# Patient Record
Sex: Female | Born: 1985
Health system: Southern US, Community
[De-identification: ages and names within clinical notes are randomized; demographics above are authoritative.]

## PROBLEM LIST (undated history)

## (undated) HISTORY — PX: TONSILLECTOMY: SUR1361

---

## 2015-05-08 ENCOUNTER — Encounter (HOSPITAL_BASED_OUTPATIENT_CLINIC_OR_DEPARTMENT_OTHER): Payer: Self-pay | Admitting: *Deleted

## 2015-05-08 ENCOUNTER — Emergency Department (HOSPITAL_BASED_OUTPATIENT_CLINIC_OR_DEPARTMENT_OTHER)
Admission: EM | Admit: 2015-05-08 | Discharge: 2015-05-08 | Disposition: A | Discharge status: Home / Self Care | Payer: No Typology Code available for payment source | Attending: Emergency Medicine | Admitting: Emergency Medicine

## 2015-05-08 ENCOUNTER — Emergency Department (HOSPITAL_BASED_OUTPATIENT_CLINIC_OR_DEPARTMENT_OTHER): Payer: No Typology Code available for payment source

## 2015-05-08 DIAGNOSIS — Y9289 Other specified places as the place of occurrence of the external cause: Secondary | ICD-10-CM | POA: Diagnosis not present

## 2015-05-08 DIAGNOSIS — S99922A Unspecified injury of left foot, initial encounter: Secondary | ICD-10-CM | POA: Diagnosis present

## 2015-05-08 DIAGNOSIS — S79921A Unspecified injury of right thigh, initial encounter: Secondary | ICD-10-CM | POA: Insufficient documentation

## 2015-05-08 DIAGNOSIS — Y998 Other external cause status: Secondary | ICD-10-CM | POA: Diagnosis not present

## 2015-05-08 DIAGNOSIS — Y9389 Activity, other specified: Secondary | ICD-10-CM | POA: Diagnosis not present

## 2015-05-08 DIAGNOSIS — W010XXA Fall on same level from slipping, tripping and stumbling without subsequent striking against object, initial encounter: Secondary | ICD-10-CM | POA: Diagnosis not present

## 2015-05-08 DIAGNOSIS — S93402A Sprain of unspecified ligament of left ankle, initial encounter: Secondary | ICD-10-CM | POA: Insufficient documentation

## 2015-05-08 MED ORDER — IBUPROFEN 800 MG PO TABS: 800.0000 mg | ORAL_TABLET | Freq: Three times a day (TID) | ORAL | Status: DC

## 2015-05-08 NOTE — Discharge Instructions (Signed)

## 2015-05-08 NOTE — ED Notes (Signed)
Slipped and fell in the shower yesterday. Pain in her right hip and left foot.

## 2015-05-08 NOTE — ED Provider Notes (Signed)
CSN: [id number]     Arrival date & time 05/08/15  1427 History   First MD Initiated Contact with Patient 05/08/15 1438     Chief Complaint  Patient presents with  . Fall  . Leg Pain     (Consider location/radiation/quality/duration/timing/severity/associated sxs/prior Treatment) HPI Comments: Pt comes in with c/o right thigh pain and left foot and ankle pain that started after she slipped and fell out of the shower yesterday. No loc. No dizziness  Patient is a 29 y.o. female presenting with fall. The history is provided by the patient. No language interpreter was used.  Fall This is a new problem. The current episode started yesterday. The problem occurs constantly. The problem has been unchanged. Pertinent negatives include no numbness or weakness. The symptoms are aggravated by walking. She has tried ice and heat for the symptoms.    History reviewed. No pertinent past medical history. Past Surgical History  Procedure Laterality Date  . Tonsillectomy     No family history on file. History  Substance Use Topics  . Smoking status: Never Smoker   . Smokeless tobacco: Not on file  . Alcohol Use: Yes     Comment: weekly   OB History    No data available     Review of Systems  Neurological: Negative for weakness and numbness.  All other systems reviewed and are negative.     Allergies  Review of patient's allergies indicates no known allergies.  Home Medications   Prior to Admission medications   Not on File   BP 127/88 mmHg  Pulse 81  Temp(Src) 98.2 F (36.8 C) (Oral)  Resp 16  Ht 5\' 2"  (1.575 m)  Wt 258 lb (117.028 kg)  BMI 47.18 kg/m2  SpO2 97%  LMP 04/08/2015 Physical Exam  Constitutional: She is oriented to person, place, and time. She appears well-developed and well-nourished.  HENT:  Head: Normocephalic and atraumatic.  Eyes: Conjunctivae and EOM are normal. Pupils are equal, round, and reactive to light.  Cardiovascular: Normal rate and regular  rhythm.   Pulmonary/Chest: Effort normal and breath sounds normal.  Abdominal: Soft. Bowel sounds are normal. There is no tenderness.  Musculoskeletal:  Mild swelling and tenderness to the left ankle. Tender to the bottom of the left foot. Pulses intact. Tender on the right lateral thigh. Full rom  Neurological: She is alert and oriented to person, place, and time. Coordination normal.  Skin: Skin is warm and dry.  Psychiatric: She has a normal mood and affect.  Nursing note and vitals reviewed.   ED Course  Procedures (including critical care time) Labs Review Labs Reviewed - No data to display  Imaging Review Dg Ankle Complete Left  05/08/2015   CLINICAL DATA:  Larey SeatFell in shower yesterday, now with lateral foot pain.  EXAM: LEFT ANKLE COMPLETE - 3+ VIEW  COMPARISON:  Foot radiographs-earlier same day  FINDINGS: There is mild diffuse soft tissue swelling about the ankle. No fracture or dislocation. Joint spaces are preserved. The ankle mortise is preserved. No definite ankle joint. No plantar calcaneal spur. No radiopaque foreign body.  IMPRESSION: Nonspecific mild diffuse soft tissue swelling about the ankle without associated fracture or dislocation.   Electronically Signed   By: Simonne ComeJohn  Watts M.D.   On: 05/08/2015 15:20   Dg Foot Complete Left  05/08/2015   CLINICAL DATA:  29 year old female with a history of fall in the shower  EXAM: LEFT FOOT - COMPLETE 3+ VIEW  COMPARISON:  None.  FINDINGS:  There is no evidence of fracture or dislocation. There is no evidence of arthropathy or other focal bone abnormality. Soft tissues are unremarkable.  IMPRESSION: Negative for acute bony abnormality.  Signed,  Yvone NeuJaime S. Loreta AveWagner, DO  [facility]  [facility]   Electronically Signed   By: Gilmer MorJaime  Wagner D.O.   On: 05/08/2015 15:21     EKG Interpretation None      MDM   Final diagnoses:  Ankle sprain, left, initial encounter    No acute bony abnormality  noted. Pt placed in ace wrap and crutches for comfort. Given ibuprofen and follow up with DR. Pearletha Forgehudnall as needed    Teressa LowerVrinda Jlon Betker, NP 05/08/15 1527  Elwin MochaBlair Walden, MD 05/08/15 1538

## 2016-08-17 DIAGNOSIS — R102 Pelvic and perineal pain: Secondary | ICD-10-CM | POA: Insufficient documentation

## 2017-12-15 DIAGNOSIS — Z3009 Encounter for other general counseling and advice on contraception: Secondary | ICD-10-CM | POA: Diagnosis not present

## 2017-12-19 DIAGNOSIS — B349 Viral infection, unspecified: Secondary | ICD-10-CM | POA: Diagnosis not present

## 2018-01-06 ENCOUNTER — Emergency Department (HOSPITAL_BASED_OUTPATIENT_CLINIC_OR_DEPARTMENT_OTHER)
Admission: EM | Admit: 2018-01-06 | Discharge: 2018-01-06 | Disposition: A | Discharge status: Home / Self Care | Payer: 59 | Attending: Emergency Medicine | Admitting: Emergency Medicine

## 2018-01-06 ENCOUNTER — Encounter (HOSPITAL_BASED_OUTPATIENT_CLINIC_OR_DEPARTMENT_OTHER): Payer: Self-pay

## 2018-01-06 ENCOUNTER — Other Ambulatory Visit: Payer: Self-pay

## 2018-01-06 DIAGNOSIS — N939 Abnormal uterine and vaginal bleeding, unspecified: Secondary | ICD-10-CM | POA: Insufficient documentation

## 2018-01-06 DIAGNOSIS — Z79899 Other long term (current) drug therapy: Secondary | ICD-10-CM | POA: Diagnosis not present

## 2018-01-06 LAB — CBC
HCT: 35.9 % — ABNORMAL LOW (ref 36.0–46.0)
Hemoglobin: 11.3 g/dL — ABNORMAL LOW (ref 12.0–15.0)
MCH: 27.2 pg (ref 26.0–34.0)
MCHC: 31.5 g/dL (ref 30.0–36.0)
MCV: 86.5 fL (ref 78.0–100.0)
PLATELETS: 371 10*3/uL (ref 150–400)
RBC: 4.15 MIL/uL (ref 3.87–5.11)
RDW: 14.4 % (ref 11.5–15.5)
WBC: 12.1 10*3/uL — AB (ref 4.0–10.5)

## 2018-01-06 LAB — URINALYSIS, ROUTINE W REFLEX MICROSCOPIC
Bilirubin Urine: NEGATIVE
Glucose, UA: NEGATIVE mg/dL
KETONES UR: NEGATIVE mg/dL
LEUKOCYTES UA: NEGATIVE
NITRITE: NEGATIVE
PROTEIN: NEGATIVE mg/dL
Specific Gravity, Urine: >1.03 — ABNORMAL HIGH (ref 1.005–1.030)
pH: 6 (ref 5.0–8.0)

## 2018-01-06 LAB — URINALYSIS, MICROSCOPIC (REFLEX)

## 2018-01-06 LAB — PREGNANCY, URINE: PREG TEST UR: NEGATIVE

## 2018-01-06 MED ORDER — FENTANYL CITRATE (PF) 100 MCG/2ML IJ SOLN: 50.0000 ug | Freq: Once | INTRAMUSCULAR | Status: DC

## 2018-01-06 NOTE — Discharge Instructions (Signed)
You were seen and evaluated in the emergency department for heavy menstrual bleeding. Her tests here were normal. You are not anemic.  It is safe to take 600 mg of ibuprofen once per day to help decrease bleeding between now and Tuesday when you have your OB appointment.  Reasons to return to care would be if you have continued to have heavy bleeding in this area lightheadedness, shortness of breath, or palpitations.

## 2018-01-06 NOTE — ED Triage Notes (Signed)
C/o heavy vaginal bleeding x 2 days-states her job sent her here because she bled through 2 pairs of pants at [employer]-steady gait

## 2018-01-06 NOTE — ED Notes (Signed)
Pt verbalizes understanding of d/c instructions and denies any further needs at this time. 

## 2018-01-06 NOTE — ED Provider Notes (Signed)
[facility] EMERGENCY DEPARTMENT Provider Note   CSN: [id number] Arrival date & [id number]: 01/06/18  2126     History   Chief Complaint Chief Complaint  Patient presents with  . Vaginal Bleeding    HPI Christine Aguirre is a 32 y.o. female presenting for heavy menstrual bleeding. She reports that she had onset of menstrual bleeding yesterday, and since then she has required a tampon and pad every 2 and half hours. She bled through 2 pairs of pants at work today. Because of this it was suggested she come into the emergency department to have her hemoglobin checked. She denies shortness of breath and palpitations, no dizziness. She has had some cramping with menses.  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 This is her third menstrual cycle since restarting menses after delivering her baby.  History reviewed. No pertinent past medical history.  There are no active problems to display for this patient.   Past Surgical History:  Procedure Laterality Date  . TONSILLECTOMY      OB History    No data available       Home Medications    Prior to Admission medications   Medication Sig Start Date End Date Taking? Authorizing Provider  PHENTERMINE HCL PO Take by mouth.   Yes [provider]    Family History No family history on file.  Social History Social History   Tobacco Use  . Smoking status: Never Smoker  . Smokeless tobacco: Never Used  Substance Use Topics  . Alcohol use: No    Frequency: Never    Comment: weekly  . Drug use: No     Allergies   Belladonna alkaloids   Review of Systems Review of Systems No N/V/D/C, no urinary symptoms   Physical Exam Updated Vital Signs BP (!) 145/99 (BP Location: Left Arm)   Pulse 88   Temp 97.8 F (36.6 C) (Oral)   Resp 16   Ht 5\' 2"  (1.575 m)   Wt 119.3 kg (263 lb)   LMP 01/04/2018   SpO2 100%   BMI 48.10 kg/m   Physical Exam  Constitutional: She is oriented to  person, place, and time. She appears well-developed and well-nourished.  HENT:  Head: Normocephalic and atraumatic.  Neck: Neck supple.  Cardiovascular: Normal rate and regular rhythm. Exam reveals no gallop and no friction rub.  No murmur heard. Pulmonary/Chest: Effort normal and breath sounds normal.  Abdominal: Soft. She exhibits no distension. There is no tenderness. There is no guarding.  Genitourinary: Vagina normal.  Genitourinary Comments: +visualized cervix which is without abnormalities, note blood in the vault with no clotting.   Musculoskeletal: She exhibits no deformity.  Neurological: She is alert and oriented to person, place, and time.  Skin: Skin is warm and dry.     ED Treatments / Results  Labs (all labs ordered are listed, but only abnormal results are displayed) Labs Reviewed  URINALYSIS, ROUTINE W REFLEX MICROSCOPIC - Abnormal; Notable for the following components:      Result Value   APPearance CLOUDY (*)    Specific Gravity, Urine >1.030 (*)    Hgb urine dipstick LARGE (*)    All other components within normal limits  CBC - Abnormal; Notable for the following components:   WBC 12.1 (*)    Hemoglobin 11.3 (*)    HCT 35.9 (*)    All other components within normal limits  URINALYSIS, MICROSCOPIC (REFLEX) - Abnormal; Notable for the  following components:   Bacteria, UA RARE (*)    Squamous Epithelial / LPF 0-5 (*)    All other components within normal limits  PREGNANCY, URINE    EKG  EKG Interpretation None       Radiology No results found.  Procedures Procedures (including critical care time)  Medications Ordered in ED Medications  fentaNYL (SUBLIMAZE) injection 50 mcg (not administered)     Initial Impression / Assessment and Plan / ED Course  I have reviewed the triage vital signs and the nursing notes.  Pertinent labs & imaging results that were available during my care of the patient were reviewed by me and considered in my medical  decision making (see chart for details).    Previously healthy 32 year old female presenting for heavy menstrual bleeding. Patient appears well on exam. Lab work including hemoglobin was normal. She is not symptomatic other than mild cramping menses. She is advised to use NSAIDs to decrease menstrual bleeding. She has follow-up with OB on Tuesday for IUD placement.  Return precautions were discussed. Patient appeared well and stable for discharge.   Final Clinical Impressions(s) / ED Diagnoses   Final diagnoses:  Vaginal bleeding    ED Discharge Orders    None       Howard Pouch, MD 01/06/18 4098    Gwyneth Sprout, MD 01/07/18 1112

## 2018-01-10 DIAGNOSIS — Z30430 Encounter for insertion of intrauterine contraceptive device: Secondary | ICD-10-CM | POA: Diagnosis not present

## 2019-01-16 DIAGNOSIS — Z01419 Encounter for gynecological examination (general) (routine) without abnormal findings: Secondary | ICD-10-CM | POA: Diagnosis not present

## 2019-01-16 DIAGNOSIS — Z124 Encounter for screening for malignant neoplasm of cervix: Secondary | ICD-10-CM | POA: Diagnosis not present

## 2019-01-16 DIAGNOSIS — Z30431 Encounter for routine checking of intrauterine contraceptive device: Secondary | ICD-10-CM | POA: Diagnosis not present

## 2019-01-16 DIAGNOSIS — Z1151 Encounter for screening for human papillomavirus (HPV): Secondary | ICD-10-CM | POA: Diagnosis not present

## 2019-01-16 DIAGNOSIS — Z3202 Encounter for pregnancy test, result negative: Secondary | ICD-10-CM | POA: Diagnosis not present

## 2019-01-24 MED FILL — CYCLOBENZAPRINE 10 MG TAB: 10 | 10 days supply | Qty: 10 | Fill #0

## 2019-06-04 ENCOUNTER — Encounter: Payer: 59 | Admitting: Podiatry

## 2019-06-04 ENCOUNTER — Ambulatory Visit: Payer: 59

## 2019-06-04 DIAGNOSIS — M2041 Other hammer toe(s) (acquired), right foot: Secondary | ICD-10-CM

## 2019-06-04 DIAGNOSIS — M2042 Other hammer toe(s) (acquired), left foot: Secondary | ICD-10-CM

## 2019-06-04 NOTE — Progress Notes (Signed)
This encounter was created in error - please disregard.

## 2019-06-13 ENCOUNTER — Ambulatory Visit (INDEPENDENT_AMBULATORY_CARE_PROVIDER_SITE_OTHER): Payer: 59

## 2019-06-13 ENCOUNTER — Other Ambulatory Visit: Payer: Self-pay

## 2019-06-13 ENCOUNTER — Other Ambulatory Visit: Payer: Self-pay | Admitting: Podiatry

## 2019-06-13 ENCOUNTER — Ambulatory Visit (INDEPENDENT_AMBULATORY_CARE_PROVIDER_SITE_OTHER): Payer: 59 | Admitting: Podiatry

## 2019-06-13 VITALS — Temp 97.9°F

## 2019-06-13 DIAGNOSIS — M2042 Other hammer toe(s) (acquired), left foot: Secondary | ICD-10-CM | POA: Diagnosis not present

## 2019-06-13 DIAGNOSIS — M79672 Pain in left foot: Secondary | ICD-10-CM | POA: Diagnosis not present

## 2019-06-13 DIAGNOSIS — M2041 Other hammer toe(s) (acquired), right foot: Secondary | ICD-10-CM | POA: Diagnosis not present

## 2019-06-13 DIAGNOSIS — M79671 Pain in right foot: Secondary | ICD-10-CM | POA: Diagnosis not present

## 2019-06-13 NOTE — Patient Instructions (Signed)
Pre-Operative Instructions  Congratulations, you have decided to take an important step towards improving your quality of life.  You can be assured that the doctors and staff at [facility] will be with you every step of the way.  Here are some important things you should know:  1. Plan to be at the surgery center/hospital at least 1 (one) hour prior to your scheduled time, unless otherwise directed by the surgical center/hospital staff.  You must have a responsible adult accompany you, remain during the surgery and drive you home.  Make sure you have directions to the surgical center/hospital to ensure you arrive on time. 2. If you are having surgery at [facility] or [facility] hospitals, you will need a copy of your medical history and physical form from your family physician within one month prior to the date of surgery. We will give you a form for your primary physician to complete.  3. We make every effort to accommodate the date you request for surgery.  However, there are times where surgery dates or times have to be moved.  We will contact you as soon as possible if a change in schedule is required.   4. No aspirin/ibuprofen for one week before surgery.  If you are on aspirin, any non-steroidal anti-inflammatory medications (Mobic, Aleve, Ibuprofen) should not be taken seven (7) days prior to your surgery.  You make take Tylenol for pain prior to surgery.  5. Medications - If you are taking daily heart and blood pressure medications, seizure, reflux, allergy, asthma, anxiety, pain or diabetes medications, make sure you notify the surgery center/hospital before the day of surgery so they can tell you which medications you should take or avoid the day of surgery. 6. No food or drink after midnight the night before surgery unless directed otherwise by surgical center/hospital staff. 7. No alcoholic beverages 24-hours prior to surgery.  No smoking 24-hours prior or 24-hours after  surgery. 8. Wear loose pants or shorts. They should be loose enough to fit over bandages, boots, and casts. 9. Don't wear slip-on shoes. Sneakers are preferred. 10. Bring your boot with you to the surgery center/hospital.  Also bring crutches or a walker if your physician has prescribed it for you.  If you do not have this equipment, it will be provided for you after surgery. 11. If you have not been contacted by the surgery center/hospital by the day before your surgery, call to confirm the date and time of your surgery. 12. Leave-time from work may vary depending on the type of surgery you have.  Appropriate arrangements should be made prior to surgery with your employer. 13. Prescriptions will be provided immediately following surgery by your doctor.  Fill these as soon as possible after surgery and take the medication as directed. Pain medications will not be refilled on weekends and must be approved by the doctor. 14. Remove nail polish on the operative foot and avoid getting pedicures prior to surgery. 15. Wash the night before surgery.  The night before surgery wash the foot and leg well with water and the antibacterial soap provided. Be sure to pay special attention to beneath the toenails and in between the toes.  Wash for at least three (3) minutes. Rinse thoroughly with water and dry well with a towel.  Perform this wash unless told not to do so by your physician.  Enclosed: 1 Ice pack (please put in freezer the night before surgery)   1 Hibiclens skin cleaner     Pre-op instructions  If you have any questions regarding the instructions, please do not hesitate to call our office.  [place]: [address], , [place] [address] -- [phone]  [place]: [address] [place], [place] [address] -- [phone]  [place]: [address]  [place], [place] [address] -- [phone]  [place]: [address], [address], [place], [place] [address] -- [phone]  Website:  [email or web address] 

## 2019-06-14 ENCOUNTER — Other Ambulatory Visit: Payer: Self-pay | Admitting: Podiatry

## 2019-06-14 DIAGNOSIS — M2041 Other hammer toe(s) (acquired), right foot: Secondary | ICD-10-CM

## 2019-06-17 NOTE — Progress Notes (Signed)
Subjective:   Patient ID: Christine Aguirre, female   DOB: 33 y.o.   MRN: [id number]   HPI Patient presents stating she is got very painful fourth and fifth digits of both feet and states that she is tried to trim it she is tried to pad it and wear wider shoes without relief.  Patient states it is been going on for several years and getting worse and patient does not smoke likes to be active and is obese which is complicating factor   Review of Systems  All other systems reviewed and are negative.       Objective:  Physical Exam Vitals signs and nursing note reviewed.  Constitutional:      Appearance: She is well-developed.  Pulmonary:     Effort: Pulmonary effort is normal.  Musculoskeletal: Normal range of motion.  Skin:    General: Skin is warm.  Neurological:     Mental Status: She is alert.     Neurovascular status found to be intact muscle strength is adequate range of motion within normal limits.  Patient is found to have significant rotation fifth digit bilateral pressing into the fourth toe of both feet that are painful when palpated and making shoe gear difficult.  Patient does have keratotic lesions fourth digit bilateral that are painful.  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  I recommended distal arthroplasty digit 5 bilateral and proximal arthroplasty digit 4 and allowed patient to read consent form going over all possible complications and going over alternative treatments.  Patient wants surgery and after extensive review signed consent form and is encouraged to call with questions but needs to get it done fairly soon.  I advised her on approximate 28-month recovery and she understands this wants surgery and is scheduled for outpatient  procedures  X-rays indicates rotation fifth digit bilateral pressing into the fourth toe with enlargement around the head of proximal phalanx digit 4 bilateral

## 2019-06-29 ENCOUNTER — Telehealth: Payer: Self-pay | Admitting: *Deleted

## 2019-06-29 NOTE — Telephone Encounter (Addendum)
DOS 07/03/2019; 80165 - HAMMER TOE REPAIR 4TH B/L AND DISTAL 5TH B/L FOOT  UMR: Effective Date - 12/20/2018   Benefit percentage  60%Plan pays  40%You pay  Family integrated deductible  $0.00 to go     $2,800.00 out of $2800.00   Family integrated out-of-pocket  $4,148.81 to go     $3,851.19 out of $8000.00

## 2019-07-03 ENCOUNTER — Encounter: Payer: Self-pay | Admitting: Podiatry

## 2019-07-03 DIAGNOSIS — M2041 Other hammer toe(s) (acquired), right foot: Secondary | ICD-10-CM | POA: Diagnosis not present

## 2019-07-03 DIAGNOSIS — Z01818 Encounter for other preprocedural examination: Secondary | ICD-10-CM | POA: Diagnosis not present

## 2019-07-03 DIAGNOSIS — M2042 Other hammer toe(s) (acquired), left foot: Secondary | ICD-10-CM | POA: Diagnosis not present

## 2019-07-09 ENCOUNTER — Ambulatory Visit (INDEPENDENT_AMBULATORY_CARE_PROVIDER_SITE_OTHER): Payer: 59

## 2019-07-09 ENCOUNTER — Ambulatory Visit (INDEPENDENT_AMBULATORY_CARE_PROVIDER_SITE_OTHER): Payer: Self-pay | Admitting: Podiatry

## 2019-07-09 ENCOUNTER — Other Ambulatory Visit: Payer: Self-pay

## 2019-07-09 ENCOUNTER — Encounter: Payer: Self-pay | Admitting: Podiatry

## 2019-07-09 DIAGNOSIS — M2042 Other hammer toe(s) (acquired), left foot: Secondary | ICD-10-CM

## 2019-07-09 DIAGNOSIS — Z09 Encounter for follow-up examination after completed treatment for conditions other than malignant neoplasm: Secondary | ICD-10-CM

## 2019-07-09 DIAGNOSIS — M2041 Other hammer toe(s) (acquired), right foot: Secondary | ICD-10-CM

## 2019-07-09 NOTE — Progress Notes (Signed)
Subjective:   Patient ID: Christine Aguirre, female   DOB: 33 y.o.   MRN: [id number]   HPI Patient presents stating she is doing very well postsurgically and has had some discomfort but it is more due to the dressing   ROS      Objective:  Physical Exam  Neurovascular status intact negative Homans sign noted with patient's fourth and fifth digits bilateral showing good alignment and good rotation with stitches in place with no drainage     Assessment:  Doing well post arthroplasty digit 4 5 both feet     Plan:  H&P x-rays reviewed and advised on continued elevation compression immobilization and reappoint 2 weeks suture removal or earlier if needed  X-rays indicate satisfactory resection of bone with no indication of pathology

## 2019-07-23 ENCOUNTER — Ambulatory Visit (INDEPENDENT_AMBULATORY_CARE_PROVIDER_SITE_OTHER): Payer: 59

## 2019-07-23 ENCOUNTER — Ambulatory Visit (INDEPENDENT_AMBULATORY_CARE_PROVIDER_SITE_OTHER): Payer: 59 | Admitting: Podiatry

## 2019-07-23 ENCOUNTER — Encounter: Payer: Self-pay | Admitting: Podiatry

## 2019-07-23 ENCOUNTER — Other Ambulatory Visit: Payer: Self-pay

## 2019-07-23 VITALS — Temp 97.6°F

## 2019-07-23 DIAGNOSIS — Z09 Encounter for follow-up examination after completed treatment for conditions other than malignant neoplasm: Secondary | ICD-10-CM | POA: Diagnosis not present

## 2019-07-23 DIAGNOSIS — M2041 Other hammer toe(s) (acquired), right foot: Secondary | ICD-10-CM

## 2019-07-23 DIAGNOSIS — M2042 Other hammer toe(s) (acquired), left foot: Secondary | ICD-10-CM | POA: Diagnosis not present

## 2019-07-25 NOTE — Progress Notes (Signed)
Subjective:   Patient ID: Christine Aguirre, female   DOB: 33 y.o.   MRN: [id number]   HPI Patient states doing fine overall and states that the patient's digits are healing well and she is ready to get stitches out but overall feels good   ROS      Objective:  Physical Exam  Digital deformity digits 4 5 bilateral that have healed well from surgery with wound edges well coapted good positional component noted     Assessment:  Doing well post digital arthroplasty digit 4 5 both feet     Plan:  H&P reviewed condition stitches removed wound edges coapted well and continue with wider shoe and will be seen back as needed  X-ray indicated satisfactory resection of bone with good alignment of the digits noted

## 2019-08-29 ENCOUNTER — Ambulatory Visit (INDEPENDENT_AMBULATORY_CARE_PROVIDER_SITE_OTHER): Payer: 59 | Admitting: Podiatry

## 2019-08-29 ENCOUNTER — Other Ambulatory Visit: Payer: Self-pay

## 2019-08-29 ENCOUNTER — Encounter: Payer: Self-pay | Admitting: Podiatry

## 2019-08-29 ENCOUNTER — Ambulatory Visit (INDEPENDENT_AMBULATORY_CARE_PROVIDER_SITE_OTHER): Payer: 59

## 2019-08-29 DIAGNOSIS — M2041 Other hammer toe(s) (acquired), right foot: Secondary | ICD-10-CM

## 2019-08-29 DIAGNOSIS — M2042 Other hammer toe(s) (acquired), left foot: Secondary | ICD-10-CM

## 2019-09-01 NOTE — Progress Notes (Signed)
Subjective:   Patient ID: Christine Aguirre, female   DOB: 33 y.o.   MRN: [id number]   HPI Patient states overall doing very well with small bumps right and left fifth and fourth toe but overall doing very well with surgery neuro   ROS      Objective:  Physical Exam  Vascular status intact negative Homans sign noted fourth and fifth digits bilateral healing well wound edges well coapted     Assessment:  Doing well post arthroplasty digit 4 5 bilateral     Plan:  H&P reviewed condition and advised on continued elevation and gradual return to normal shoe gear and will be seen back as needed

## 2020-02-06 DIAGNOSIS — Z111 Encounter for screening for respiratory tuberculosis: Secondary | ICD-10-CM | POA: Diagnosis not present

## 2020-02-06 DIAGNOSIS — Z021 Encounter for pre-employment examination: Secondary | ICD-10-CM | POA: Diagnosis not present

## 2020-03-17 ENCOUNTER — Emergency Department (HOSPITAL_BASED_OUTPATIENT_CLINIC_OR_DEPARTMENT_OTHER)
Admission: EM | Admit: 2020-03-17 | Discharge: 2020-03-18 | Disposition: A | Discharge status: Home / Self Care | Payer: 59 | Attending: Emergency Medicine | Admitting: Emergency Medicine

## 2020-03-17 ENCOUNTER — Encounter (HOSPITAL_BASED_OUTPATIENT_CLINIC_OR_DEPARTMENT_OTHER): Payer: Self-pay | Admitting: *Deleted

## 2020-03-17 ENCOUNTER — Other Ambulatory Visit: Payer: Self-pay

## 2020-03-17 DIAGNOSIS — Z5321 Procedure and treatment not carried out due to patient leaving prior to being seen by health care provider: Secondary | ICD-10-CM | POA: Insufficient documentation

## 2020-03-17 DIAGNOSIS — M549 Dorsalgia, unspecified: Secondary | ICD-10-CM | POA: Insufficient documentation

## 2020-03-17 DIAGNOSIS — R0789 Other chest pain: Secondary | ICD-10-CM | POA: Insufficient documentation

## 2020-03-17 DIAGNOSIS — M25511 Pain in right shoulder: Secondary | ICD-10-CM | POA: Insufficient documentation

## 2020-03-17 NOTE — ED Triage Notes (Signed)
Pt reports substernal non radiating CP that started yesterday. Reports mid back pain, right shoulder pain. Denies injury. Reports SOB. Denies N/V

## 2020-05-14 DIAGNOSIS — N898 Other specified noninflammatory disorders of vagina: Secondary | ICD-10-CM | POA: Diagnosis not present

## 2020-05-14 DIAGNOSIS — Z3202 Encounter for pregnancy test, result negative: Secondary | ICD-10-CM | POA: Diagnosis not present

## 2020-05-14 DIAGNOSIS — Z975 Presence of (intrauterine) contraceptive device: Secondary | ICD-10-CM | POA: Diagnosis not present

## 2020-05-14 DIAGNOSIS — R1032 Left lower quadrant pain: Secondary | ICD-10-CM | POA: Diagnosis not present

## 2020-05-14 DIAGNOSIS — R102 Pelvic and perineal pain: Secondary | ICD-10-CM | POA: Diagnosis not present

## 2020-05-20 DIAGNOSIS — R519 Headache, unspecified: Secondary | ICD-10-CM | POA: Diagnosis not present

## 2020-05-20 DIAGNOSIS — Z20822 Contact with and (suspected) exposure to covid-19: Secondary | ICD-10-CM | POA: Diagnosis not present

## 2020-06-16 DIAGNOSIS — Z01419 Encounter for gynecological examination (general) (routine) without abnormal findings: Secondary | ICD-10-CM | POA: Diagnosis not present

## 2020-06-16 DIAGNOSIS — Z113 Encounter for screening for infections with a predominantly sexual mode of transmission: Secondary | ICD-10-CM | POA: Diagnosis not present

## 2020-10-07 DIAGNOSIS — Z30432 Encounter for removal of intrauterine contraceptive device: Secondary | ICD-10-CM | POA: Diagnosis not present

## 2020-12-17 DIAGNOSIS — U071 COVID-19: Secondary | ICD-10-CM | POA: Diagnosis not present

## 2021-05-07 ENCOUNTER — Encounter (HOSPITAL_BASED_OUTPATIENT_CLINIC_OR_DEPARTMENT_OTHER): Payer: Self-pay | Admitting: *Deleted

## 2021-05-07 ENCOUNTER — Other Ambulatory Visit: Payer: Self-pay

## 2021-05-07 ENCOUNTER — Emergency Department (HOSPITAL_BASED_OUTPATIENT_CLINIC_OR_DEPARTMENT_OTHER)
Admission: EM | Admit: 2021-05-07 | Discharge: 2021-05-08 | Disposition: A | Discharge status: Home / Self Care | Payer: 59 | Attending: Emergency Medicine | Admitting: Emergency Medicine

## 2021-05-07 DIAGNOSIS — Z041 Encounter for examination and observation following transport accident: Secondary | ICD-10-CM | POA: Diagnosis not present

## 2021-05-07 DIAGNOSIS — M546 Pain in thoracic spine: Secondary | ICD-10-CM | POA: Diagnosis not present

## 2021-05-07 DIAGNOSIS — Y92410 Unspecified street and highway as the place of occurrence of the external cause: Secondary | ICD-10-CM | POA: Diagnosis not present

## 2021-05-07 MED ORDER — IBUPROFEN 800 MG PO TABS
800.0000 mg | ORAL_TABLET | Freq: Once | ORAL | Status: AC
  Administered 2021-05-07: 800 mg via ORAL
  Filled 2021-05-07: qty 1
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
## 2021-05-08 ENCOUNTER — Emergency Department (HOSPITAL_BASED_OUTPATIENT_CLINIC_OR_DEPARTMENT_OTHER): Payer: 59

## 2021-05-08 DIAGNOSIS — Z041 Encounter for examination and observation following transport accident: Secondary | ICD-10-CM | POA: Diagnosis not present

## 2021-05-08 DIAGNOSIS — M546 Pain in thoracic spine: Secondary | ICD-10-CM | POA: Diagnosis not present
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
## 2021-05-08 NOTE — Discharge Instructions (Signed)
Call your primary care doctor or specialist as discussed in the next 2-3 days.   Return immediately back to the ER if:  Your symptoms worsen within the next 12-24 hours. You develop new symptoms such as new fevers, persistent vomiting, new pain, shortness of breath, or new weakness or numbness, or if you have any other concerns.  

## 2021-05-14 DIAGNOSIS — Z6841 Body Mass Index (BMI) 40.0 and over, adult: Secondary | ICD-10-CM | POA: Diagnosis not present

## 2021-05-14 DIAGNOSIS — Z3202 Encounter for pregnancy test, result negative: Secondary | ICD-10-CM | POA: Diagnosis not present

## 2021-05-14 DIAGNOSIS — N912 Amenorrhea, unspecified: Secondary | ICD-10-CM | POA: Diagnosis not present

## 2021-06-15 DIAGNOSIS — N912 Amenorrhea, unspecified: Secondary | ICD-10-CM | POA: Diagnosis not present

## 2021-09-14 DIAGNOSIS — Z760 Encounter for issue of repeat prescription: Secondary | ICD-10-CM | POA: Diagnosis not present

## 2021-09-14 DIAGNOSIS — N979 Female infertility, unspecified: Secondary | ICD-10-CM | POA: Diagnosis not present

## 2021-09-14 DIAGNOSIS — Z6841 Body Mass Index (BMI) 40.0 and over, adult: Secondary | ICD-10-CM | POA: Diagnosis not present

## 2021-09-15 ENCOUNTER — Other Ambulatory Visit (HOSPITAL_COMMUNITY): Payer: Self-pay

## 2021-09-16 ENCOUNTER — Other Ambulatory Visit (HOSPITAL_COMMUNITY): Payer: Self-pay

## 2021-09-16 MED ORDER — METFORMIN HCL ER 500 MG PO TB24
1000.0000 mg | ORAL_TABLET | Freq: Every day | ORAL | 5 refills | Status: AC
  Filled 2021-09-16: qty 60, 30d supply, fill #0

## 2021-09-16 MED ORDER — OZEMPIC (0.25 OR 0.5 MG/DOSE) 2 MG/1.5ML ~~LOC~~ SOPN
0.2500 mg | PEN_INJECTOR | SUBCUTANEOUS | 0 refills | Status: DC
  Filled 2021-09-16: qty 1.5, 56d supply, fill #0

## 2021-10-20 ENCOUNTER — Other Ambulatory Visit (HOSPITAL_COMMUNITY): Payer: Self-pay

## 2021-10-20 DIAGNOSIS — Z6841 Body Mass Index (BMI) 40.0 and over, adult: Secondary | ICD-10-CM | POA: Diagnosis not present

## 2021-10-20 MED ORDER — WEGOVY 1 MG/0.5ML ~~LOC~~ SOAJ
1.0000 mg | SUBCUTANEOUS | 0 refills | Status: DC
  Filled 2021-10-20: qty 2, 28d supply, fill #0

## 2021-10-21 ENCOUNTER — Other Ambulatory Visit (HOSPITAL_COMMUNITY): Payer: Self-pay

## 2021-10-21 MED ORDER — OZEMPIC (1 MG/DOSE) 4 MG/3ML ~~LOC~~ SOPN
1.0000 mg | PEN_INJECTOR | SUBCUTANEOUS | 0 refills | Status: DC
  Filled 2021-10-22: qty 9, 84d supply, fill #0

## 2021-10-22 ENCOUNTER — Other Ambulatory Visit (HOSPITAL_COMMUNITY): Payer: Self-pay

## 2021-10-26 ENCOUNTER — Other Ambulatory Visit (HOSPITAL_COMMUNITY): Payer: Self-pay

## 2021-10-26 MED ORDER — WEGOVY 1.7 MG/0.75ML ~~LOC~~ SOAJ
1.7000 mg | SUBCUTANEOUS | 2 refills | Status: DC
  Filled 2021-10-26: qty 3, 28d supply, fill #0

## 2021-11-02 DIAGNOSIS — N979 Female infertility, unspecified: Secondary | ICD-10-CM | POA: Diagnosis not present

## 2021-11-10 DIAGNOSIS — Z3141 Encounter for fertility testing: Secondary | ICD-10-CM | POA: Diagnosis not present

## 2022-01-21 ENCOUNTER — Other Ambulatory Visit (HOSPITAL_COMMUNITY): Payer: Self-pay

## 2022-01-21 DIAGNOSIS — Z6841 Body Mass Index (BMI) 40.0 and over, adult: Secondary | ICD-10-CM | POA: Diagnosis not present

## 2022-01-21 DIAGNOSIS — R5383 Other fatigue: Secondary | ICD-10-CM | POA: Diagnosis not present

## 2022-01-21 MED ORDER — WEGOVY 1.7 MG/0.75ML ~~LOC~~ SOAJ
1.7000 mg | SUBCUTANEOUS | 0 refills | Status: DC
  Filled 2022-01-21: qty 3, 28d supply, fill #0

## 2022-01-21 MED ORDER — WEGOVY 1.7 MG/0.75ML ~~LOC~~ SOAJ: 1.5000 mg | SUBCUTANEOUS | 0 refills | Status: DC

## 2022-01-26 ENCOUNTER — Other Ambulatory Visit (HOSPITAL_COMMUNITY): Payer: Self-pay

## 2022-01-26 MED ORDER — VITAMIN D3 250 MCG (10000 UT) PO CAPS: ORAL_CAPSULE | ORAL | 2 refills | Status: AC

## 2022-01-27 ENCOUNTER — Other Ambulatory Visit (HOSPITAL_COMMUNITY): Payer: Self-pay

## 2022-01-29 DIAGNOSIS — D72829 Elevated white blood cell count, unspecified: Secondary | ICD-10-CM | POA: Diagnosis not present

## 2022-02-04 DIAGNOSIS — N926 Irregular menstruation, unspecified: Secondary | ICD-10-CM | POA: Diagnosis not present

## 2022-02-04 DIAGNOSIS — Z01419 Encounter for gynecological examination (general) (routine) without abnormal findings: Secondary | ICD-10-CM | POA: Diagnosis not present

## 2022-02-16 DIAGNOSIS — D72829 Elevated white blood cell count, unspecified: Secondary | ICD-10-CM | POA: Diagnosis not present

## 2022-02-16 DIAGNOSIS — D649 Anemia, unspecified: Secondary | ICD-10-CM | POA: Diagnosis not present

## 2022-02-16 DIAGNOSIS — N921 Excessive and frequent menstruation with irregular cycle: Secondary | ICD-10-CM | POA: Diagnosis not present

## 2022-02-19 ENCOUNTER — Other Ambulatory Visit (HOSPITAL_COMMUNITY): Payer: Self-pay

## 2022-02-19 DIAGNOSIS — Z6841 Body Mass Index (BMI) 40.0 and over, adult: Secondary | ICD-10-CM | POA: Diagnosis not present

## 2022-02-19 MED ORDER — WEGOVY 2.4 MG/0.75ML ~~LOC~~ SOAJ
2.4000 mg | SUBCUTANEOUS | 2 refills | Status: DC
  Filled 2022-02-19 – 2022-02-22 (×3): qty 3, 28d supply, fill #0
  Filled 2022-03-23: qty 3, 28d supply, fill #1
  Filled 2022-04-21 – 2022-05-06 (×4): qty 3, 28d supply, fill #2

## 2022-02-22 ENCOUNTER — Other Ambulatory Visit (HOSPITAL_COMMUNITY): Payer: Self-pay

## 2022-03-03 ENCOUNTER — Other Ambulatory Visit (HOSPITAL_COMMUNITY): Payer: Self-pay

## 2022-03-03 DIAGNOSIS — D649 Anemia, unspecified: Secondary | ICD-10-CM | POA: Diagnosis not present

## 2022-03-03 DIAGNOSIS — N921 Excessive and frequent menstruation with irregular cycle: Secondary | ICD-10-CM | POA: Diagnosis not present

## 2022-03-03 DIAGNOSIS — D72829 Elevated white blood cell count, unspecified: Secondary | ICD-10-CM | POA: Diagnosis not present

## 2022-03-03 MED ORDER — SLOW RELEASE IRON 160 (50 FE) MG PO TBCR
1.0000 | EXTENDED_RELEASE_TABLET | Freq: Every day | ORAL | 5 refills | Status: AC
  Filled 2022-03-03: qty 30, 30d supply, fill #0

## 2022-03-18 DIAGNOSIS — D508 Other iron deficiency anemias: Secondary | ICD-10-CM | POA: Diagnosis not present

## 2022-03-23 ENCOUNTER — Other Ambulatory Visit (HOSPITAL_COMMUNITY): Payer: Self-pay

## 2022-03-25 DIAGNOSIS — D508 Other iron deficiency anemias: Secondary | ICD-10-CM | POA: Diagnosis not present

## 2022-03-26 ENCOUNTER — Other Ambulatory Visit (HOSPITAL_COMMUNITY): Payer: Self-pay

## 2022-03-26 DIAGNOSIS — Z862 Personal history of diseases of the blood and blood-forming organs and certain disorders involving the immune mechanism: Secondary | ICD-10-CM | POA: Diagnosis not present

## 2022-03-26 DIAGNOSIS — Z3202 Encounter for pregnancy test, result negative: Secondary | ICD-10-CM | POA: Diagnosis not present

## 2022-03-26 DIAGNOSIS — R350 Frequency of micturition: Secondary | ICD-10-CM | POA: Diagnosis not present

## 2022-03-26 DIAGNOSIS — N390 Urinary tract infection, site not specified: Secondary | ICD-10-CM | POA: Diagnosis not present

## 2022-04-01 DIAGNOSIS — D508 Other iron deficiency anemias: Secondary | ICD-10-CM | POA: Diagnosis not present

## 2022-04-02 ENCOUNTER — Other Ambulatory Visit (HOSPITAL_COMMUNITY): Payer: Self-pay

## 2022-04-14 DIAGNOSIS — Z800 Family history of malignant neoplasm of digestive organs: Secondary | ICD-10-CM | POA: Diagnosis not present

## 2022-04-14 DIAGNOSIS — N921 Excessive and frequent menstruation with irregular cycle: Secondary | ICD-10-CM | POA: Diagnosis not present

## 2022-04-14 DIAGNOSIS — D500 Iron deficiency anemia secondary to blood loss (chronic): Secondary | ICD-10-CM | POA: Diagnosis not present

## 2022-04-14 DIAGNOSIS — D649 Anemia, unspecified: Secondary | ICD-10-CM | POA: Diagnosis not present

## 2022-04-14 DIAGNOSIS — D72829 Elevated white blood cell count, unspecified: Secondary | ICD-10-CM | POA: Diagnosis not present

## 2022-04-16 DIAGNOSIS — R350 Frequency of micturition: Secondary | ICD-10-CM | POA: Diagnosis not present

## 2022-04-21 ENCOUNTER — Other Ambulatory Visit (HOSPITAL_COMMUNITY): Payer: Self-pay

## 2022-04-21 MED ORDER — CIPROFLOXACIN HCL 500 MG PO TABS
500.0000 mg | ORAL_TABLET | Freq: Two times a day (BID) | ORAL | 0 refills | Status: AC
  Filled 2022-04-21: qty 6, 3d supply, fill #0

## 2022-04-22 ENCOUNTER — Other Ambulatory Visit (HOSPITAL_COMMUNITY): Payer: Self-pay

## 2022-05-05 ENCOUNTER — Other Ambulatory Visit (HOSPITAL_COMMUNITY): Payer: Self-pay

## 2022-05-05 DIAGNOSIS — Z6841 Body Mass Index (BMI) 40.0 and over, adult: Secondary | ICD-10-CM | POA: Diagnosis not present

## 2022-05-06 ENCOUNTER — Other Ambulatory Visit (HOSPITAL_COMMUNITY): Payer: Self-pay

## 2022-05-26 DIAGNOSIS — D508 Other iron deficiency anemias: Secondary | ICD-10-CM | POA: Diagnosis not present

## 2022-05-26 DIAGNOSIS — N921 Excessive and frequent menstruation with irregular cycle: Secondary | ICD-10-CM | POA: Diagnosis not present

## 2022-05-26 DIAGNOSIS — D72829 Elevated white blood cell count, unspecified: Secondary | ICD-10-CM | POA: Diagnosis not present

## 2022-05-26 DIAGNOSIS — D500 Iron deficiency anemia secondary to blood loss (chronic): Secondary | ICD-10-CM | POA: Diagnosis not present

## 2022-05-26 DIAGNOSIS — Z800 Family history of malignant neoplasm of digestive organs: Secondary | ICD-10-CM | POA: Diagnosis not present

## 2022-06-16 ENCOUNTER — Other Ambulatory Visit (HOSPITAL_COMMUNITY): Payer: Self-pay

## 2022-06-16 MED ORDER — WEGOVY 2.4 MG/0.75ML ~~LOC~~ SOAJ
2.4000 mg | SUBCUTANEOUS | 0 refills | Status: AC
  Filled 2022-06-16: qty 3, 28d supply, fill #0

## 2022-06-25 ENCOUNTER — Other Ambulatory Visit (HOSPITAL_COMMUNITY): Payer: Self-pay

## 2022-07-05 DIAGNOSIS — Z6841 Body Mass Index (BMI) 40.0 and over, adult: Secondary | ICD-10-CM | POA: Diagnosis not present

## 2022-07-05 DIAGNOSIS — Z3200 Encounter for pregnancy test, result unknown: Secondary | ICD-10-CM | POA: Diagnosis not present

## 2022-07-09 DIAGNOSIS — Z3201 Encounter for pregnancy test, result positive: Secondary | ICD-10-CM | POA: Diagnosis not present

## 2022-07-13 ENCOUNTER — Encounter (HOSPITAL_BASED_OUTPATIENT_CLINIC_OR_DEPARTMENT_OTHER): Payer: Self-pay | Admitting: Pediatrics

## 2022-07-13 ENCOUNTER — Emergency Department (HOSPITAL_BASED_OUTPATIENT_CLINIC_OR_DEPARTMENT_OTHER)
Admission: EM | Admit: 2022-07-13 | Discharge: 2022-07-13 | Disposition: A | Discharge status: Home / Self Care | Payer: 59 | Attending: Emergency Medicine | Admitting: Emergency Medicine

## 2022-07-13 ENCOUNTER — Other Ambulatory Visit: Payer: Self-pay

## 2022-07-13 DIAGNOSIS — Z7984 Long term (current) use of oral hypoglycemic drugs: Secondary | ICD-10-CM | POA: Insufficient documentation

## 2022-07-13 DIAGNOSIS — Y92410 Unspecified street and highway as the place of occurrence of the external cause: Secondary | ICD-10-CM | POA: Diagnosis not present

## 2022-07-13 DIAGNOSIS — M79639 Pain in unspecified forearm: Secondary | ICD-10-CM | POA: Diagnosis not present

## 2022-07-13 DIAGNOSIS — R0789 Other chest pain: Secondary | ICD-10-CM | POA: Diagnosis not present

## 2022-07-13 DIAGNOSIS — M79631 Pain in right forearm: Secondary | ICD-10-CM
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
## 2022-07-13 NOTE — Discharge Instructions (Signed)
Tylenol for pain.  Follow up with your family doc and OB.  Return for vaginal bleeding, worsening abdominal cramping.
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
## 2022-07-21 DIAGNOSIS — Z800 Family history of malignant neoplasm of digestive organs: Secondary | ICD-10-CM | POA: Diagnosis not present

## 2022-07-21 DIAGNOSIS — D508 Other iron deficiency anemias: Secondary | ICD-10-CM | POA: Diagnosis not present

## 2022-07-21 DIAGNOSIS — Z3A01 Less than 8 weeks gestation of pregnancy: Secondary | ICD-10-CM | POA: Diagnosis not present

## 2022-07-21 DIAGNOSIS — O3680X0 Pregnancy with inconclusive fetal viability, not applicable or unspecified: Secondary | ICD-10-CM | POA: Diagnosis not present

## 2022-07-21 DIAGNOSIS — N921 Excessive and frequent menstruation with irregular cycle: Secondary | ICD-10-CM | POA: Diagnosis not present

## 2022-07-21 DIAGNOSIS — Z3689 Encounter for other specified antenatal screening: Secondary | ICD-10-CM | POA: Diagnosis not present

## 2022-07-21 DIAGNOSIS — D500 Iron deficiency anemia secondary to blood loss (chronic): Secondary | ICD-10-CM | POA: Diagnosis not present

## 2022-07-21 DIAGNOSIS — Z3482 Encounter for supervision of other normal pregnancy, second trimester: Secondary | ICD-10-CM | POA: Diagnosis not present

## 2022-07-21 DIAGNOSIS — O209 Hemorrhage in early pregnancy, unspecified: Secondary | ICD-10-CM | POA: Diagnosis not present

## 2022-07-21 DIAGNOSIS — Z3A25 25 weeks gestation of pregnancy: Secondary | ICD-10-CM | POA: Diagnosis not present

## 2022-07-21 DIAGNOSIS — O99211 Obesity complicating pregnancy, first trimester: Secondary | ICD-10-CM | POA: Diagnosis not present

## 2022-07-21 DIAGNOSIS — O200 Threatened abortion: Secondary | ICD-10-CM | POA: Diagnosis not present

## 2022-07-21 DIAGNOSIS — O3481 Maternal care for other abnormalities of pelvic organs, first trimester: Secondary | ICD-10-CM | POA: Diagnosis not present

## 2022-07-21 DIAGNOSIS — O34531 Maternal care for retroversion of gravid uterus, first trimester: Secondary | ICD-10-CM | POA: Diagnosis not present

## 2022-07-21 DIAGNOSIS — N8311 Corpus luteum cyst of right ovary: Secondary | ICD-10-CM | POA: Diagnosis not present

## 2022-07-21 DIAGNOSIS — D72829 Elevated white blood cell count, unspecified: Secondary | ICD-10-CM | POA: Diagnosis not present

## 2022-08-06 DIAGNOSIS — Z3689 Encounter for other specified antenatal screening: Secondary | ICD-10-CM | POA: Diagnosis not present

## 2022-08-06 DIAGNOSIS — O09521 Supervision of elderly multigravida, first trimester: Secondary | ICD-10-CM | POA: Diagnosis not present

## 2022-08-06 DIAGNOSIS — O99211 Obesity complicating pregnancy, first trimester: Secondary | ICD-10-CM | POA: Diagnosis not present

## 2022-08-06 DIAGNOSIS — O34219 Maternal care for unspecified type scar from previous cesarean delivery: Secondary | ICD-10-CM | POA: Diagnosis not present

## 2022-08-06 DIAGNOSIS — Z3A08 8 weeks gestation of pregnancy: Secondary | ICD-10-CM | POA: Diagnosis not present

## 2022-08-06 DIAGNOSIS — O3680X0 Pregnancy with inconclusive fetal viability, not applicable or unspecified: Secondary | ICD-10-CM | POA: Diagnosis not present

## 2022-09-02 ENCOUNTER — Other Ambulatory Visit (HOSPITAL_COMMUNITY): Payer: Self-pay

## 2022-09-02 DIAGNOSIS — O09521 Supervision of elderly multigravida, first trimester: Secondary | ICD-10-CM | POA: Diagnosis not present

## 2022-09-02 DIAGNOSIS — B3731 Acute candidiasis of vulva and vagina: Secondary | ICD-10-CM | POA: Diagnosis not present

## 2022-09-02 DIAGNOSIS — Z6841 Body Mass Index (BMI) 40.0 and over, adult: Secondary | ICD-10-CM | POA: Diagnosis not present

## 2022-09-02 DIAGNOSIS — O99211 Obesity complicating pregnancy, first trimester: Secondary | ICD-10-CM | POA: Diagnosis not present

## 2022-09-02 DIAGNOSIS — O23591 Infection of other part of genital tract in pregnancy, first trimester: Secondary | ICD-10-CM | POA: Diagnosis not present

## 2022-09-02 DIAGNOSIS — Z3A12 12 weeks gestation of pregnancy: Secondary | ICD-10-CM | POA: Diagnosis not present

## 2022-09-02 DIAGNOSIS — E669 Obesity, unspecified: Secondary | ICD-10-CM | POA: Diagnosis not present

## 2022-09-02 DIAGNOSIS — O368310 Maternal care for abnormalities of the fetal heart rate or rhythm, first trimester, not applicable or unspecified: Secondary | ICD-10-CM | POA: Diagnosis not present

## 2022-09-02 DIAGNOSIS — B9689 Other specified bacterial agents as the cause of diseases classified elsewhere: Secondary | ICD-10-CM | POA: Diagnosis not present

## 2022-09-02 MED ORDER — METRONIDAZOLE 0.75 % VA GEL
1.0000 | Freq: Every day | VAGINAL | 0 refills | Status: AC
  Filled 2022-09-02: qty 70, 5d supply, fill #0

## 2022-09-02 MED ORDER — TERCONAZOLE 80 MG VA SUPP
80.0000 mg | Freq: Every evening | VAGINAL | 0 refills | Status: AC
  Filled 2022-09-02: qty 3, 3d supply, fill #0

## 2022-09-03 ENCOUNTER — Other Ambulatory Visit (HOSPITAL_COMMUNITY): Payer: Self-pay

## 2022-09-30 DIAGNOSIS — O09522 Supervision of elderly multigravida, second trimester: Secondary | ICD-10-CM | POA: Diagnosis not present

## 2022-09-30 DIAGNOSIS — Z3A16 16 weeks gestation of pregnancy: Secondary | ICD-10-CM | POA: Diagnosis not present

## 2022-09-30 DIAGNOSIS — Z3689 Encounter for other specified antenatal screening: Secondary | ICD-10-CM | POA: Diagnosis not present

## 2022-10-08 DIAGNOSIS — Z23 Encounter for immunization: Secondary | ICD-10-CM | POA: Diagnosis not present

## 2022-10-20 ENCOUNTER — Emergency Department (HOSPITAL_BASED_OUTPATIENT_CLINIC_OR_DEPARTMENT_OTHER)
Admission: EM | Admit: 2022-10-20 | Discharge: 2022-10-20 | Disposition: A | Discharge status: Home / Self Care | Payer: 59 | Attending: Emergency Medicine | Admitting: Emergency Medicine

## 2022-10-20 ENCOUNTER — Other Ambulatory Visit: Payer: Self-pay

## 2022-10-20 ENCOUNTER — Encounter (HOSPITAL_BASED_OUTPATIENT_CLINIC_OR_DEPARTMENT_OTHER): Payer: Self-pay

## 2022-10-20 DIAGNOSIS — Z20822 Contact with and (suspected) exposure to covid-19: Secondary | ICD-10-CM | POA: Diagnosis not present

## 2022-10-20 DIAGNOSIS — B9789 Other viral agents as the cause of diseases classified elsewhere: Secondary | ICD-10-CM | POA: Diagnosis not present

## 2022-10-20 DIAGNOSIS — J069 Acute upper respiratory infection, unspecified: Secondary | ICD-10-CM

## 2022-10-20 DIAGNOSIS — O99512 Diseases of the respiratory system complicating pregnancy, second trimester: Secondary | ICD-10-CM | POA: Diagnosis not present

## 2022-10-20 DIAGNOSIS — R059 Cough, unspecified: Secondary | ICD-10-CM | POA: Diagnosis present

## 2022-10-20 DIAGNOSIS — J101 Influenza due to other identified influenza virus with other respiratory manifestations: Secondary | ICD-10-CM | POA: Insufficient documentation

## 2022-10-20 DIAGNOSIS — J111 Influenza due to unidentified influenza virus with other respiratory manifestations: Secondary | ICD-10-CM | POA: Diagnosis not present

## 2022-10-20 LAB — RESP PANEL BY RT-PCR (FLU A&B, COVID) ARPGX2
Influenza A by PCR: POSITIVE — AB
Influenza B by PCR: NEGATIVE
SARS Coronavirus 2 by RT PCR: NEGATIVE

## 2022-10-20 NOTE — ED Triage Notes (Signed)
C/o fever tmax 145f for 2 days with sore throat, cough, stuffy nose, headache, chest tightness. [redacted] weeks pregnant. Last took tylenol at 0100.

## 2022-10-20 NOTE — ED Provider Notes (Signed)
[place] EMERGENCY DEPARTMENT Provider Note   CSN: [id number] Arrival date & time: 10/20/22  1214     History {Add pertinent medical, surgical, social history, OB history to HPI:1} Chief Complaint  Patient presents with   URI    Christine Aguirre is a 36 y.o. female.  Pt with c/o fever, non prod cough, nasal congestion/runny nose for past 2-3 days. Symptoms acute onset, moderate, persistent. Child w similar uri symptoms, no other specific known ill contacts. Is months pregnant, +pnc, denies complications, no abd or pelvic pain. Pt denies headache. No neck pain/stiffness. No trouble breathing or swallowing. No nvd. No dysuria or gu c/o. No rash.   The history is provided by the patient and medical records.  URI Presenting symptoms: congestion, cough, rhinorrhea and sore throat   Presenting symptoms: no fever   Associated symptoms: no headaches and no neck pain        Home Medications Prior to Admission medications   Medication Sig Start Date End Date Taking? Authorizing Provider  Cholecalciferol (VITAMIN D3) 250 MCG (10000 UT) capsule Take 1 capsule (10,000 Units) by mouth daily. 01/26/22     ciprofloxacin (CIPRO) 500 MG tablet Take 1 tablet (500 mg total) by mouth 2 (two) times daily for 3 days. 04/21/22     ferrous sulfate (SLOW RELEASE IRON) 160 (50 Fe) MG TBCR SR tablet Take 1 tablet by mouth daily. 03/03/22     HYDROcodone-acetaminophen (NORCO) 7.5-325 MG tablet TK 1 T PO Q 4 H PRN P. MDD 6 TS. 12/22/18   [provider]  ibuprofen (ADVIL) 800 MG tablet Take by mouth. 04/20/17   [provider]  levonorgestrel (MIRENA) 20 MCG/24HR IUD by Intrauterine route.    [provider]  metFORMIN (GLUCOPHAGE-XR) 500 MG 24 hr tablet Take 2 tablets (1,000 mg total) by mouth daily with breakfast. 09/14/21     metroNIDAZOLE (METROGEL) 0.75 % vaginal gel Place 1 applicatorful vaginally at bedtime for 5 days 09/02/22     PHENTERMINE HCL PO Take by mouth.     [provider]  terconazole (TERAZOL 3) 80 MG vaginal suppository Place 1 suppository (80 mg total) vaginally nightly for 3 days 09/02/22     WEGOVY 2.4 MG/0.75ML SOAJ Inject 2.4 mg into the skin once a week. 06/16/22         Allergies    Belladonna alkaloids    Review of Systems   Review of Systems  Constitutional:  Negative for chills and fever.  HENT:  Positive for congestion, rhinorrhea and sore throat. Negative for trouble swallowing.   Eyes:  Negative for discharge and redness.  Respiratory:  Positive for cough. Negative for shortness of breath.   Cardiovascular:  Negative for chest pain.  Gastrointestinal:  Negative for abdominal pain, diarrhea and vomiting.  Genitourinary:  Negative for dysuria and flank pain.  Musculoskeletal:  Negative for neck pain and neck stiffness.  Skin:  Negative for rash.  Neurological:  Negative for headaches.    Physical Exam Updated Vital Signs BP (!) 119/97 (BP Location: Left Arm)   Pulse (!) 109   Temp 98.2 F (36.8 C) (Oral)   Resp 18   Ht 1.575 m (5\' 2" )   Wt 125.2 kg   LMP 07/06/2022   SpO2 100%   BMI 50.48 kg/m  Physical Exam Vitals and nursing note reviewed.  Constitutional:      Appearance: Normal appearance. She is well-developed.  HENT:     Head: Atraumatic.  Right Ear: Tympanic membrane normal.     Left Ear: Tympanic membrane normal.     Nose: Congestion present.     Mouth/Throat:     Mouth: Mucous membranes are moist.     Pharynx: Oropharynx is clear.  Eyes:     General: No scleral icterus.    Conjunctiva/sclera: Conjunctivae normal.  Neck:     Trachea: No tracheal deviation.     Comments: No stiffness or rigidity.  Cardiovascular:     Rate and Rhythm: Normal rate and regular rhythm.     Pulses: Normal pulses.     Heart sounds: Normal heart sounds. No murmur heard.    No friction rub. No gallop.  Pulmonary:     Effort: Pulmonary effort is normal. No respiratory distress.     Breath sounds: Normal  breath sounds.  Abdominal:     General: There is no distension.     Palpations: Abdomen is soft.     Tenderness: There is no abdominal tenderness.  Musculoskeletal:        General: No swelling.     Cervical back: Normal range of motion and neck supple. No rigidity. No muscular tenderness.     Right lower leg: No edema.     Left lower leg: No edema.  Lymphadenopathy:     Cervical: No cervical adenopathy.  Skin:    General: Skin is warm and dry.     Findings: No rash.  Neurological:     Mental Status: She is alert.     Comments: Alert, speech normal.   Psychiatric:        Mood and Affect: Mood normal.     ED Results / Procedures / Treatments   Labs (all labs ordered are listed, but only abnormal results are displayed) Labs Reviewed  RESP PANEL BY RT-PCR (FLU A&B, COVID) [id number]    EKG None  Radiology No results found.  Procedures Procedures  {Document cardiac monitor, telemetry assessment procedure when appropriate:1}  Medications Ordered in ED Medications - No data to display  ED Course/ Medical Decision Making/ A&P                           Medical Decision Making  Labs ordered.   Po fluids.  Reviewed nursing notes and prior charts for additional history.   Symptoms felt most c/w viral uri - info provided.  Labs reviewed/interpreted by me - covid neg.   Rec pcp f/u.  Return precautions provided.     {Document critical care time when appropriate:1} {Document review of labs and clinical decision tools ie heart score, Chads2Vasc2 etc:1}  {Document your independent review of radiology images, and any outside records:1} {Document your discussion with family members, caretakers, and with consultants:1} {Document social determinants of health affecting pt's care:1} {Document your decision making why or why not admission, treatments were needed:1} Final Clinical Impression(s) / ED Diagnoses Final diagnoses:  None    Rx / DC Orders ED Discharge Orders      None

## 2022-10-20 NOTE — ED Notes (Signed)
Patient transported to X-ray 

## 2022-10-20 NOTE — Discharge Instructions (Signed)
It was our pleasure to provide your ER care today - we hope that you feel better.  Your flu test is positive - see attached info.  Drink plenty of fluids/stay well hydrated. May take acetaminophen as need for fever.  Follow up with primary care doctor in 1-2 weeks if symptoms fail to improve/resolve.  Return to ER if worse, new symptoms, increased trouble breathing, or other concern.

## 2022-11-01 ENCOUNTER — Other Ambulatory Visit (HOSPITAL_COMMUNITY): Payer: Self-pay

## 2022-11-01 DIAGNOSIS — O34219 Maternal care for unspecified type scar from previous cesarean delivery: Secondary | ICD-10-CM | POA: Diagnosis not present

## 2022-11-01 DIAGNOSIS — Z3A20 20 weeks gestation of pregnancy: Secondary | ICD-10-CM | POA: Diagnosis not present

## 2022-11-01 DIAGNOSIS — O99212 Obesity complicating pregnancy, second trimester: Secondary | ICD-10-CM | POA: Diagnosis not present

## 2022-11-01 DIAGNOSIS — O09212 Supervision of pregnancy with history of pre-term labor, second trimester: Secondary | ICD-10-CM | POA: Diagnosis not present

## 2022-11-01 DIAGNOSIS — Z3689 Encounter for other specified antenatal screening: Secondary | ICD-10-CM | POA: Diagnosis not present

## 2022-11-01 MED ORDER — ONDANSETRON HCL 4 MG PO TABS
4.0000 mg | ORAL_TABLET | Freq: Three times a day (TID) | ORAL | 0 refills | Status: AC | PRN
  Filled 2022-11-01: qty 20, 7d supply, fill #0
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
## 2022-11-09 ENCOUNTER — Other Ambulatory Visit (HOSPITAL_COMMUNITY): Payer: Self-pay

## 2022-11-29 DIAGNOSIS — Z362 Encounter for other antenatal screening follow-up: Secondary | ICD-10-CM | POA: Diagnosis not present

## 2022-11-29 DIAGNOSIS — Z3A24 24 weeks gestation of pregnancy: Secondary | ICD-10-CM | POA: Diagnosis not present

## 2022-11-29 DIAGNOSIS — O322XX0 Maternal care for transverse and oblique lie, not applicable or unspecified: Secondary | ICD-10-CM | POA: Diagnosis not present

## 2022-11-29 DIAGNOSIS — O99212 Obesity complicating pregnancy, second trimester: Secondary | ICD-10-CM | POA: Diagnosis not present

## 2022-11-29 DIAGNOSIS — Z3689 Encounter for other specified antenatal screening: Secondary | ICD-10-CM | POA: Diagnosis not present

## 2022-12-21 DIAGNOSIS — Z3A28 28 weeks gestation of pregnancy: Secondary | ICD-10-CM | POA: Diagnosis not present

## 2022-12-21 DIAGNOSIS — O09523 Supervision of elderly multigravida, third trimester: Secondary | ICD-10-CM | POA: Diagnosis not present

## 2022-12-21 DIAGNOSIS — Z3689 Encounter for other specified antenatal screening: Secondary | ICD-10-CM | POA: Diagnosis not present

## 2022-12-27 DIAGNOSIS — O99810 Abnormal glucose complicating pregnancy: Secondary | ICD-10-CM | POA: Diagnosis not present

## 2022-12-27 DIAGNOSIS — Z3689 Encounter for other specified antenatal screening: Secondary | ICD-10-CM | POA: Diagnosis not present

## 2022-12-31 DIAGNOSIS — O0993 Supervision of high risk pregnancy, unspecified, third trimester: Secondary | ICD-10-CM | POA: Diagnosis not present

## 2022-12-31 DIAGNOSIS — Z3A29 29 weeks gestation of pregnancy: Secondary | ICD-10-CM | POA: Diagnosis not present

## 2022-12-31 DIAGNOSIS — O24410 Gestational diabetes mellitus in pregnancy, diet controlled: Secondary | ICD-10-CM | POA: Diagnosis not present

## 2023-01-19 DIAGNOSIS — Z3689 Encounter for other specified antenatal screening: Secondary | ICD-10-CM | POA: Diagnosis not present

## 2023-01-19 DIAGNOSIS — O99213 Obesity complicating pregnancy, third trimester: Secondary | ICD-10-CM | POA: Diagnosis not present

## 2023-01-19 DIAGNOSIS — Z3A32 32 weeks gestation of pregnancy: Secondary | ICD-10-CM | POA: Diagnosis not present

## 2023-01-19 DIAGNOSIS — O0993 Supervision of high risk pregnancy, unspecified, third trimester: Secondary | ICD-10-CM | POA: Diagnosis not present

## 2023-01-19 DIAGNOSIS — O24410 Gestational diabetes mellitus in pregnancy, diet controlled: Secondary | ICD-10-CM | POA: Diagnosis not present

## 2023-01-19 DIAGNOSIS — O09523 Supervision of elderly multigravida, third trimester: Secondary | ICD-10-CM | POA: Diagnosis not present

## 2023-01-21 DIAGNOSIS — M25551 Pain in right hip: Secondary | ICD-10-CM | POA: Diagnosis not present

## 2023-01-21 DIAGNOSIS — M7062 Trochanteric bursitis, left hip: Secondary | ICD-10-CM | POA: Diagnosis not present

## 2023-02-03 DIAGNOSIS — Z3A34 34 weeks gestation of pregnancy: Secondary | ICD-10-CM | POA: Diagnosis not present

## 2023-02-03 DIAGNOSIS — O99213 Obesity complicating pregnancy, third trimester: Secondary | ICD-10-CM | POA: Diagnosis not present

## 2023-02-03 DIAGNOSIS — O09523 Supervision of elderly multigravida, third trimester: Secondary | ICD-10-CM | POA: Diagnosis not present

## 2023-02-03 DIAGNOSIS — O24410 Gestational diabetes mellitus in pregnancy, diet controlled: Secondary | ICD-10-CM | POA: Diagnosis not present

## 2023-02-03 DIAGNOSIS — O0993 Supervision of high risk pregnancy, unspecified, third trimester: Secondary | ICD-10-CM | POA: Diagnosis not present

## 2023-02-09 DIAGNOSIS — O99213 Obesity complicating pregnancy, third trimester: Secondary | ICD-10-CM | POA: Diagnosis not present

## 2023-02-09 DIAGNOSIS — O24410 Gestational diabetes mellitus in pregnancy, diet controlled: Secondary | ICD-10-CM | POA: Diagnosis not present

## 2023-02-09 DIAGNOSIS — Z3A35 35 weeks gestation of pregnancy: Secondary | ICD-10-CM | POA: Diagnosis not present

## 2023-02-09 DIAGNOSIS — O09523 Supervision of elderly multigravida, third trimester: Secondary | ICD-10-CM | POA: Diagnosis not present

## 2023-02-17 DIAGNOSIS — Z3A36 36 weeks gestation of pregnancy: Secondary | ICD-10-CM | POA: Diagnosis not present

## 2023-02-17 DIAGNOSIS — O09523 Supervision of elderly multigravida, third trimester: Secondary | ICD-10-CM | POA: Diagnosis not present

## 2023-02-17 DIAGNOSIS — O99213 Obesity complicating pregnancy, third trimester: Secondary | ICD-10-CM | POA: Diagnosis not present

## 2023-02-17 DIAGNOSIS — Z3689 Encounter for other specified antenatal screening: Secondary | ICD-10-CM | POA: Diagnosis not present

## 2023-02-17 DIAGNOSIS — O0993 Supervision of high risk pregnancy, unspecified, third trimester: Secondary | ICD-10-CM | POA: Diagnosis not present

## 2023-02-17 DIAGNOSIS — O99820 Streptococcus B carrier state complicating pregnancy: Secondary | ICD-10-CM | POA: Diagnosis not present

## 2023-02-17 DIAGNOSIS — O24410 Gestational diabetes mellitus in pregnancy, diet controlled: Secondary | ICD-10-CM | POA: Diagnosis not present

## 2023-02-17 DIAGNOSIS — O24113 Pre-existing diabetes mellitus, type 2, in pregnancy, third trimester: Secondary | ICD-10-CM | POA: Diagnosis not present

## 2023-02-24 DIAGNOSIS — O24410 Gestational diabetes mellitus in pregnancy, diet controlled: Secondary | ICD-10-CM | POA: Diagnosis not present

## 2023-03-03 DIAGNOSIS — N83291 Other ovarian cyst, right side: Secondary | ICD-10-CM | POA: Diagnosis not present

## 2023-03-03 DIAGNOSIS — Z3A38 38 weeks gestation of pregnancy: Secondary | ICD-10-CM | POA: Diagnosis not present

## 2023-03-03 DIAGNOSIS — Z3A01 Less than 8 weeks gestation of pregnancy: Secondary | ICD-10-CM | POA: Diagnosis not present

## 2023-03-03 DIAGNOSIS — O3481 Maternal care for other abnormalities of pelvic organs, first trimester: Secondary | ICD-10-CM | POA: Diagnosis not present

## 2023-03-03 DIAGNOSIS — O09891 Supervision of other high risk pregnancies, first trimester: Secondary | ICD-10-CM | POA: Diagnosis not present

## 2023-03-03 DIAGNOSIS — O34219 Maternal care for unspecified type scar from previous cesarean delivery: Secondary | ICD-10-CM | POA: Diagnosis not present

## 2023-03-07 DIAGNOSIS — Z3493 Encounter for supervision of normal pregnancy, unspecified, third trimester: Secondary | ICD-10-CM | POA: Diagnosis not present

## 2023-03-07 DIAGNOSIS — O34219 Maternal care for unspecified type scar from previous cesarean delivery: Secondary | ICD-10-CM | POA: Diagnosis not present

## 2023-03-07 DIAGNOSIS — O99214 Obesity complicating childbirth: Secondary | ICD-10-CM | POA: Diagnosis not present

## 2023-03-07 DIAGNOSIS — O99892 Other specified diseases and conditions complicating childbirth: Secondary | ICD-10-CM | POA: Diagnosis not present

## 2023-03-07 DIAGNOSIS — Z3A39 39 weeks gestation of pregnancy: Secondary | ICD-10-CM | POA: Diagnosis not present

## 2023-03-07 DIAGNOSIS — O34211 Maternal care for low transverse scar from previous cesarean delivery: Secondary | ICD-10-CM | POA: Diagnosis not present

## 2023-03-07 DIAGNOSIS — N736 Female pelvic peritoneal adhesions (postinfective): Secondary | ICD-10-CM | POA: Diagnosis not present

## 2023-03-07 DIAGNOSIS — O24420 Gestational diabetes mellitus in childbirth, diet controlled: Secondary | ICD-10-CM | POA: Diagnosis not present

## 2023-03-07 DIAGNOSIS — O134 Gestational [pregnancy-induced] hypertension without significant proteinuria, complicating childbirth: Secondary | ICD-10-CM | POA: Diagnosis not present

## 2023-03-13 DIAGNOSIS — Z3483 Encounter for supervision of other normal pregnancy, third trimester: Secondary | ICD-10-CM | POA: Diagnosis not present

## 2023-03-13 DIAGNOSIS — Z3482 Encounter for supervision of other normal pregnancy, second trimester: Secondary | ICD-10-CM | POA: Diagnosis not present

## 2023-04-14 DIAGNOSIS — O24430 Gestational diabetes mellitus in the puerperium, diet controlled: Secondary | ICD-10-CM | POA: Diagnosis not present

## 2023-04-14 DIAGNOSIS — Z3009 Encounter for other general counseling and advice on contraception: Secondary | ICD-10-CM | POA: Diagnosis not present

## 2023-04-14 DIAGNOSIS — Z124 Encounter for screening for malignant neoplasm of cervix: Secondary | ICD-10-CM | POA: Diagnosis not present

## 2023-04-26 ENCOUNTER — Other Ambulatory Visit (HOSPITAL_COMMUNITY): Payer: Self-pay

## 2023-04-27 ENCOUNTER — Other Ambulatory Visit (HOSPITAL_COMMUNITY): Payer: Self-pay

## 2023-04-27 MED ORDER — NORETHINDRONE-ETH ESTRADIOL 0.5-35 MG-MCG PO TABS
1.0000 | ORAL_TABLET | Freq: Every day | ORAL | 3 refills | Status: DC
  Filled 2023-04-27: qty 84, 84d supply, fill #0

## 2023-04-27 MED ORDER — NORETHINDRONE 0.35 MG PO TABS
1.0000 | ORAL_TABLET | Freq: Every day | ORAL | 4 refills | Status: AC
  Filled 2023-04-27 – 2023-05-18 (×3): qty 84, 84d supply, fill #0

## 2023-04-27 MED ORDER — LABETALOL HCL 200 MG PO TABS
200.0000 mg | ORAL_TABLET | Freq: Two times a day (BID) | ORAL | 5 refills | Status: AC
  Filled 2023-04-27 – 2023-05-18 (×3): qty 60, 30d supply, fill #0

## 2023-05-10 ENCOUNTER — Other Ambulatory Visit (HOSPITAL_COMMUNITY): Payer: Self-pay

## 2023-05-18 ENCOUNTER — Other Ambulatory Visit (HOSPITAL_COMMUNITY): Payer: Self-pay

## 2023-05-18 ENCOUNTER — Other Ambulatory Visit: Payer: Self-pay

## 2024-10-12 ENCOUNTER — Other Ambulatory Visit (HOSPITAL_COMMUNITY): Payer: Self-pay

## 2024-10-12 MED ORDER — FLUZONE 0.5 ML IM SUSY
PREFILLED_SYRINGE | INTRAMUSCULAR | 0 refills | Status: AC
  Filled 2024-10-12: qty 0.5, 1d supply, fill #0
